# Patient Record
Sex: Female | Born: 1995 | Race: Black or African American | Hispanic: No | Marital: Single | Smoking: Current some day smoker
Health system: Southern US, Community
[De-identification: ages and names within clinical notes are randomized; demographics above are authoritative.]

## PROBLEM LIST (undated history)

## (undated) DIAGNOSIS — Z789 Other specified health status: Secondary | ICD-10-CM

## (undated) DIAGNOSIS — Q913 Trisomy 18, unspecified: Secondary | ICD-10-CM

## (undated) HISTORY — DX: Other specified health status: Z78.9

## (undated) HISTORY — PX: NO PAST SURGERIES: SHX2092

---

## 2019-08-17 ENCOUNTER — Encounter (HOSPITAL_COMMUNITY): Payer: Self-pay | Admitting: Obstetrics & Gynecology

## 2019-08-17 ENCOUNTER — Other Ambulatory Visit: Payer: Self-pay

## 2019-08-17 ENCOUNTER — Inpatient Hospital Stay (HOSPITAL_COMMUNITY): Payer: Self-pay

## 2019-08-17 ENCOUNTER — Inpatient Hospital Stay (HOSPITAL_COMMUNITY)
Admission: AD | Admit: 2019-08-17 | Discharge: 2019-08-18 | Disposition: A | Discharge status: Home / Self Care | Payer: Self-pay | Attending: Obstetrics & Gynecology | Admitting: Obstetrics & Gynecology

## 2019-08-17 DIAGNOSIS — O209 Hemorrhage in early pregnancy, unspecified: Secondary | ICD-10-CM | POA: Insufficient documentation

## 2019-08-17 DIAGNOSIS — O4690 Antepartum hemorrhage, unspecified, unspecified trimester: Secondary | ICD-10-CM

## 2019-08-17 DIAGNOSIS — Z3A01 Less than 8 weeks gestation of pregnancy: Secondary | ICD-10-CM | POA: Insufficient documentation

## 2019-08-17 DIAGNOSIS — O3680X0 Pregnancy with inconclusive fetal viability, not applicable or unspecified: Secondary | ICD-10-CM

## 2019-08-17 LAB — CBC
HCT: 37.6 % (ref 36.0–46.0)
Hemoglobin: 11.7 g/dL — ABNORMAL LOW (ref 12.0–15.0)
MCH: 24.9 pg — ABNORMAL LOW (ref 26.0–34.0)
MCHC: 31.1 g/dL (ref 30.0–36.0)
MCV: 80 fL (ref 80.0–100.0)
Platelets: 352 10*3/uL (ref 150–400)
RBC: 4.7 MIL/uL (ref 3.87–5.11)
RDW: 15.3 % (ref 11.5–15.5)
WBC: 8.7 10*3/uL (ref 4.0–10.5)
nRBC: 0 % (ref 0.0–0.2)

## 2019-08-17 LAB — ABO/RH: ABO/RH(D): O POS

## 2019-08-17 LAB — WET PREP, GENITAL
Clue Cells Wet Prep HPF POC: NONE SEEN
Sperm: NONE SEEN
Trich, Wet Prep: NONE SEEN
Yeast Wet Prep HPF POC: NONE SEEN

## 2019-08-17 LAB — URINALYSIS, ROUTINE W REFLEX MICROSCOPIC
Bilirubin Urine: NEGATIVE
Glucose, UA: NEGATIVE mg/dL
Ketones, ur: NEGATIVE mg/dL
Leukocytes,Ua: NEGATIVE
Nitrite: NEGATIVE
Protein, ur: NEGATIVE mg/dL
RBC / HPF: >50 RBC/hpf — ABNORMAL HIGH (ref 0–5)
Specific Gravity, Urine: 1.013 (ref 1.005–1.030)
pH: 5 (ref 5.0–8.0)

## 2019-08-17 LAB — HCG, QUANTITATIVE, PREGNANCY: hCG, Beta Chain, Quant, S: 125 m[IU]/mL — ABNORMAL HIGH (ref <5)

## 2019-08-17 LAB — POCT PREGNANCY, URINE: Preg Test, Ur: POSITIVE — AB

## 2019-08-17 NOTE — MAU Note (Addendum)
Pt states she found out she was pregnant. Went to hospital in [place] for some "mild bleeding" 2 days ago. Bleeding started last Saturday. States hcg was 96 and nothing was seen in womb. Here for worsening bleeding. States she passed a few clots yesterday. States she has to change pad multiple times a day because she doesn't like the odor. Denies pain. Had some minor cramping earlier today. LMP: 07/01/2019.

## 2019-08-17 NOTE — MAU Provider Note (Signed)
History     CSN: [id number]  Arrival date and time: 08/17/19 2009   First Provider Initiated Contact with Patient 08/17/19 2201      Chief Complaint  Patient presents with  . Vaginal Bleeding   Emily Marsh is a 24 y.o. G4P3003 at 6.5 weeks by Definite LMP of 07/01/2019.  She presents today for Vaginal Bleeding.  She had positive UPT 2 weeks ago and had light bleeding one week later.  She states she went to the hospital and was told that it could be miscarriage, but they were not sure.  Patient states her hCG was 96 on Thursday at a hospital in [place].  Patient reports that she was initially seeing blood only with wiping, but is now wearing a pad.  She also endorses passing clots that of varying sizes, but expresses concern about one the size of a "big green grape" that she noticed in the toilet.  Patient denies pain since onset of bleeding.  She also denies sexual activity. She reports she had some discharge, but contributes this to normal discharge for pregnancy denying itching, burning, or odor.       OB History    Gravida  4   Para  3   Term  3   Preterm  0   AB  0   Living  3     SAB  0   TAB  0   Ectopic  0   Multiple  0   Live Births  3           History reviewed. No pertinent past medical history.  History reviewed. No pertinent surgical history.  No family history on file.  Social History   Tobacco Use  . Smoking status: Never Smoker  . Smokeless tobacco: Never Used  Substance Use Topics  . Alcohol use: Not Currently  . Drug use: Never    Allergies: No Known Allergies  No medications prior to admission.    Review of Systems  Constitutional: Negative for chills and fever.  Respiratory: Negative for cough and shortness of breath.   Gastrointestinal: Negative for abdominal pain, nausea and vomiting.  Genitourinary: Positive for vaginal bleeding. Negative for difficulty urinating, dysuria, pelvic pain and vaginal discharge.   Neurological: Negative for dizziness, light-headedness and headaches.   Physical Exam   Blood pressure (!) 132/57, pulse 70, temperature 98.5 F (36.9 C), temperature source Oral, resp. rate 20, height 5\' 8"  (1.727 m), weight 100.5 kg, last menstrual period 07/01/2019, SpO2 100 %.  Physical Exam  Constitutional: She is oriented to person, place, and time. She appears well-developed and well-nourished. No distress.  HENT:  Head: Normocephalic and atraumatic.  Eyes: Conjunctivae are normal.  Cardiovascular: Normal rate, regular rhythm and normal heart sounds.  Respiratory: Effort normal and breath sounds normal.  GI: Soft. Bowel sounds are normal. There is no abdominal tenderness.  Genitourinary: Uterus is not enlarged. Cervix exhibits no motion tenderness and no friability.    Vaginal bleeding present.     No vaginal discharge.  There is bleeding in the vagina.    Genitourinary Comments: Speculum Exam: -Normal External Genitalia: Non tender, no apparent discharge at introitus.  -Vaginal Vault: Pink mucosa with good rugae. Small amt blood noted and removed with faux swab x 1 -wet prep collected -Cervix:Pink, no lesions, cysts, or polyps.  Appears closed. No active bleeding from os-GC/CT collected -Bimanual Exam:  Closed    Musculoskeletal:        General: No edema. Normal  range of motion.     Cervical back: Normal range of motion.  Neurological: She is alert and oriented to person, place, and time.  Skin: Skin is warm and dry.  Psychiatric: She has a normal mood and affect. Her behavior is normal.    MAU Course  Procedures Results for orders placed or performed during the hospital encounter of 08/17/19 (from the past 24 hour(s))  Pregnancy, urine POC     Status: Abnormal   Collection Time: 08/17/19  8:49 PM  Result Value Ref Range   Preg Test, Ur POSITIVE (A) NEGATIVE  Urinalysis, Routine w reflex microscopic     Status: Abnormal   Collection Time: 08/17/19  9:13 PM  Result  Value Ref Range   Color, Urine YELLOW YELLOW   APPearance CLEAR CLEAR   Specific Gravity, Urine 1.013 1.005 - 1.030   pH 5.0 5.0 - 8.0   Glucose, UA NEGATIVE NEGATIVE mg/dL   Hgb urine dipstick LARGE (A) NEGATIVE   Bilirubin Urine NEGATIVE NEGATIVE   Ketones, ur NEGATIVE NEGATIVE mg/dL   Protein, ur NEGATIVE NEGATIVE mg/dL   Nitrite NEGATIVE NEGATIVE   Leukocytes,Ua NEGATIVE NEGATIVE   RBC / HPF >50 (H) 0 - 5 RBC/hpf   WBC, UA 6-10 0 - 5 WBC/hpf   Bacteria, UA FEW (A) NONE SEEN   Squamous Epithelial / LPF 0-5 0 - 5   Mucus PRESENT   Wet prep, genital     Status: Abnormal   Collection Time: 08/17/19 10:14 PM  Result Value Ref Range   Yeast Wet Prep HPF POC NONE SEEN NONE SEEN   Trich, Wet Prep NONE SEEN NONE SEEN   Clue Cells Wet Prep HPF POC NONE SEEN NONE SEEN   WBC, Wet Prep HPF POC FEW (A) NONE SEEN   Sperm NONE SEEN   CBC     Status: Abnormal   Collection Time: 08/17/19 10:34 PM  Result Value Ref Range   WBC 8.7 4.0 - 10.5 K/uL   RBC 4.70 3.87 - 5.11 MIL/uL   Hemoglobin 11.7 (L) 12.0 - 15.0 g/dL   HCT 62.8 31.5 - 17.6 %   MCV 80.0 80.0 - 100.0 fL   MCH 24.9 (L) 26.0 - 34.0 pg   MCHC 31.1 30.0 - 36.0 g/dL   RDW 16.0 73.7 - 10.6 %   Platelets 352 150 - 400 K/uL   nRBC 0.0 0.0 - 0.2 %  hCG, quantitative, pregnancy     Status: Abnormal   Collection Time: 08/17/19 10:34 PM  Result Value Ref Range   hCG, Beta Chain, Quant, S 125 (H) <5 mIU/mL  ABO/Rh     Status: None   Collection Time: 08/17/19 10:34 PM  Result Value Ref Range   ABO/RH(D) O POS    No rh immune globuloin      NOT A RH IMMUNE GLOBULIN CANDIDATE, PT RH POSITIVE Performed at [facility], [address], [place], [place] [address]    US OB LESS THAN 14 WEEKS WITH OB TRANSVAGINAL  Result Date: 08/17/2019 CLINICAL DATA:  Bleeding EXAM: OBSTETRIC <14 WK [place] AND TRANSVAGINAL OB US TECHNIQUE: Both transabdominal and transvaginal ultrasound examinations were performed for complete evaluation of the  gestation as well as the maternal uterus, adnexal regions, and pelvic cul-de-sac. Transvaginal technique was performed to assess early pregnancy. COMPARISON:  None. FINDINGS: Intrauterine gestational sac: Single Yolk sac:  Not Visualized. Embryo:  Not Visualized. Cardiac Activity: Not Visualized. MSD: 3.3 mm   5 w  0 d Subchorionic hemorrhage:  None visualized. Maternal uterus/adnexae: There is a probable corpus luteal cyst involving the left ovary. The right ovary is grossly unremarkable. There is a trace amount of free fluid in the patient's pelvis. IMPRESSION: 1. Probable early gestational sac at 5 weeks and 0 days. Recommend follow-up quantitative B-HCG levels and follow-up US in 14 days to assess viability. This recommendation follows SRU consensus guidelines: Diagnostic Criteria for Nonviable Pregnancy Early in the First Trimester. [place] [employer] Med 2013; 258:5277-82. 2. No maternal abnormality detected. Electronically Signed   By: Constance Holster M.D.   On: 08/17/2019 23:34    MDM Pelvic Exam; Wet Prep and GC/CT Labs: UA, UPT, CBC, hCG, ABO Ultrasound Assessment and Plan  24 year old U2P5361 at 6.5weeks Vaginal Bleeding  -Reviewed POC with patient. -Exam performed and findings discussed.  -Cultures collected and pending.  -Patient informed that results may return inconclusive considering that provider unable to review previous results.   -Labs ordered. -Will send for [place]  Deshanti Adcox L Adajah Cocking 08/17/2019, 10:01 PM   Reassessment (12:44 AM)  -Labs and [place] return as above. -Informed that quant did not rise as anticipated, but other factors could  can cause variations in results (like time of draw and/or lab used). -Patient questions if she is having a miscarriage or has an ectopic pregnancy. -Patient informed that further evaluation is necessary and recommendation for repeat quant in 48 hours given. -Patient agreeable and scheduled for follow up quant on Tuesday at 2pm at [facility]. -Bleeding  precautions given. -Patient without further questions or concerns.  -Encouraged to call or return to MAU if symptoms worsen or with the onset of new symptoms. -Discharged to home in stable condition.  Maryann Conners MSN, CNM Advanced Practice Provider, [facility] [employer]

## 2019-08-18 DIAGNOSIS — O3680X0 Pregnancy with inconclusive fetal viability, not applicable or unspecified: Secondary | ICD-10-CM

## 2019-08-19 LAB — GC/CHLAMYDIA PROBE AMP (~~LOC~~) NOT AT ARMC
Chlamydia: NEGATIVE
Comment: NEGATIVE
Comment: NORMAL
Neisseria Gonorrhea: NEGATIVE

## 2019-08-20 ENCOUNTER — Ambulatory Visit: Payer: Self-pay

## 2019-08-21 ENCOUNTER — Telehealth: Payer: Self-pay

## 2019-08-21 NOTE — Telephone Encounter (Signed)
Called pt at and pt contact on 08/20/19 at 1600 to follow up on missed stat beta appt. Error message received that call could not be completed at either number.  Called pt at 0820; error message received that phone is not in service. Called pt contact at 0820; error message received that call cannot be completed at this time.

## 2019-08-26 ENCOUNTER — Telehealth: Payer: Self-pay | Admitting: *Deleted

## 2019-08-26 ENCOUNTER — Other Ambulatory Visit: Payer: Self-pay

## 2019-08-26 DIAGNOSIS — O4690 Antepartum hemorrhage, unspecified, unspecified trimester: Secondary | ICD-10-CM

## 2019-08-26 NOTE — Telephone Encounter (Signed)
Emily Marsh called front desk and wants to get bhcg checked. Per chart review was supposed to get stat bhcg 08/21/19  But [facility]. Patient reported negative pregnancy test at home and bleeding stopped. Discussed with Dr. Debroah Loop and ordered may get non stat bhcg.  Legrand Como

## 2019-08-27 LAB — BETA HCG QUANT (REF LAB): hCG Quant: <1 m[IU]/mL

## 2019-12-12 ENCOUNTER — Emergency Department (HOSPITAL_COMMUNITY)
Admission: EM | Admit: 2019-12-12 | Discharge: 2019-12-12 | Disposition: A | Discharge status: Home / Self Care | Payer: No Typology Code available for payment source | Attending: Emergency Medicine | Admitting: Emergency Medicine

## 2019-12-12 ENCOUNTER — Encounter (HOSPITAL_COMMUNITY): Payer: Self-pay

## 2019-12-12 ENCOUNTER — Other Ambulatory Visit: Payer: Self-pay

## 2019-12-12 DIAGNOSIS — M79605 Pain in left leg: Secondary | ICD-10-CM | POA: Diagnosis not present

## 2019-12-12 DIAGNOSIS — R52 Pain, unspecified: Secondary | ICD-10-CM | POA: Diagnosis present

## 2019-12-12 DIAGNOSIS — Y999 Unspecified external cause status: Secondary | ICD-10-CM | POA: Diagnosis not present

## 2019-12-12 DIAGNOSIS — S50811A Abrasion of right forearm, initial encounter: Secondary | ICD-10-CM | POA: Insufficient documentation

## 2019-12-12 DIAGNOSIS — Y939 Activity, unspecified: Secondary | ICD-10-CM | POA: Insufficient documentation

## 2019-12-12 DIAGNOSIS — Y929 Unspecified place or not applicable: Secondary | ICD-10-CM | POA: Insufficient documentation

## 2019-12-12 DIAGNOSIS — M542 Cervicalgia: Secondary | ICD-10-CM | POA: Insufficient documentation

## 2019-12-12 MED ORDER — TIZANIDINE HCL 2 MG PO CAPS: 2.0000 mg | ORAL_CAPSULE | Freq: Three times a day (TID) | ORAL | 0 refills | Status: AC

## 2019-12-12 MED ORDER — NAPROXEN 500 MG PO TABS: 500.0000 mg | ORAL_TABLET | Freq: Two times a day (BID) | ORAL | 0 refills | Status: DC
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
## 2019-12-12 NOTE — ED Notes (Signed)
Discharge instructions reviewed with pt. Pt verbalized understanding.   

## 2020-05-07 DIAGNOSIS — O09299 Supervision of pregnancy with other poor reproductive or obstetric history, unspecified trimester: Secondary | ICD-10-CM | POA: Insufficient documentation

## 2020-07-21 DIAGNOSIS — Z720 Tobacco use: Secondary | ICD-10-CM | POA: Insufficient documentation

## 2020-09-01 DIAGNOSIS — O365990 Maternal care for other known or suspected poor fetal growth, unspecified trimester, not applicable or unspecified: Secondary | ICD-10-CM | POA: Insufficient documentation

## 2021-02-16 ENCOUNTER — Ambulatory Visit (INDEPENDENT_AMBULATORY_CARE_PROVIDER_SITE_OTHER): Payer: Medicaid Other | Admitting: *Deleted

## 2021-02-16 ENCOUNTER — Other Ambulatory Visit: Payer: Self-pay

## 2021-02-16 VITALS — BP 130/79 | HR 76 | Temp 98.3°F | Ht 68.0 in | Wt 234.6 lb

## 2021-02-16 DIAGNOSIS — Z3201 Encounter for pregnancy test, result positive: Secondary | ICD-10-CM | POA: Diagnosis not present

## 2021-02-16 DIAGNOSIS — Z789 Other specified health status: Secondary | ICD-10-CM

## 2021-02-16 LAB — POCT URINE PREGNANCY: Preg Test, Ur: POSITIVE — AB

## 2021-02-16 NOTE — Progress Notes (Signed)
   Emily Marsh presents today for UPT. She has no unusual complaints. LMP: Unknown  Patient reported that she delivered Mar 27, 2022and baby passed on 10-Feb-2021. 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 Patient just moved here from [place].    OBJECTIVE: Appears well, in no apparent distress.  OB History     Gravida  5   Para  3   Term  3   Preterm  0   AB  0   Living  3      SAB  0   IAB  0   Ectopic  0   Multiple  0   Live Births  3          Home UPT Result:Positive In-Office UPT result: Positive I have reviewed the patient's medical, obstetrical, social, and family histories, and medications.   ASSESSMENT: Positive pregnancy test  PLAN Prenatal care to be completed at: [facility] Ultrasound <14 weeks ordered to confirm dating and viability  Clovis Pu, RN

## 2021-02-18 ENCOUNTER — Telehealth: Payer: Self-pay | Admitting: Obstetrics and Gynecology

## 2021-02-18 ENCOUNTER — Other Ambulatory Visit: Payer: Self-pay | Admitting: Obstetrics and Gynecology

## 2021-02-18 ENCOUNTER — Ambulatory Visit
Admission: RE | Admit: 2021-02-18 | Discharge: 2021-02-18 | Disposition: A | Discharge status: Home / Self Care | Payer: Medicaid Other | Source: Ambulatory Visit | Attending: Obstetrics and Gynecology | Admitting: Obstetrics and Gynecology

## 2021-02-18 ENCOUNTER — Other Ambulatory Visit: Payer: Self-pay

## 2021-02-18 DIAGNOSIS — Z789 Other specified health status: Secondary | ICD-10-CM

## 2021-02-18 NOTE — Telephone Encounter (Signed)
Attempted to call patient to discuss [place] results.  Not able to leave VM as VM is full.    Venia Carbon I, NP 02/18/2021 2:15 PM

## 2021-03-04 IMAGING — US US OB < 14 WEEKS - US OB TV
1 series · 15 of 28 positions shown · non-contrast
Comparison: None.

CLINICAL DATA: Bleeding

EXAM:
OBSTETRIC <14 WK US AND TRANSVAGINAL OB US
TECHNIQUE: Both transabdominal and transvaginal ultrasound examinations were
performed for complete evaluation of the gestation as well as the
maternal uterus, adnexal regions, and pelvic cul-de-sac.
Transvaginal technique was performed to assess early pregnancy.

[Series 1: us ob < 14 weeks - us ob tv · 37 acquisitions, 15 frames shown]
[im 1/37]
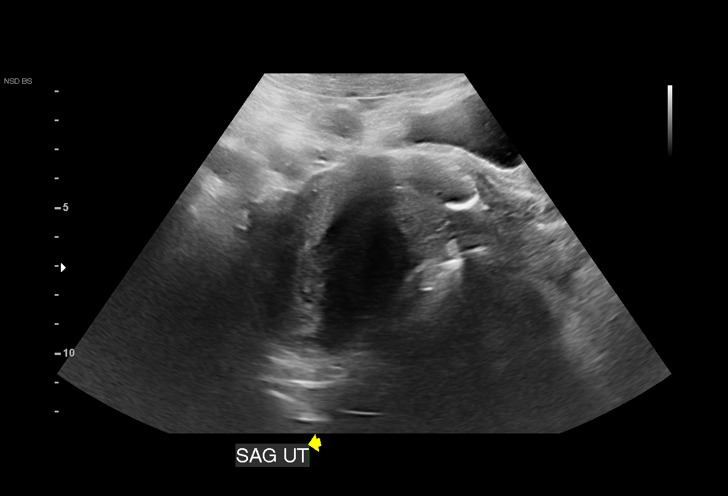
[im 3/37]
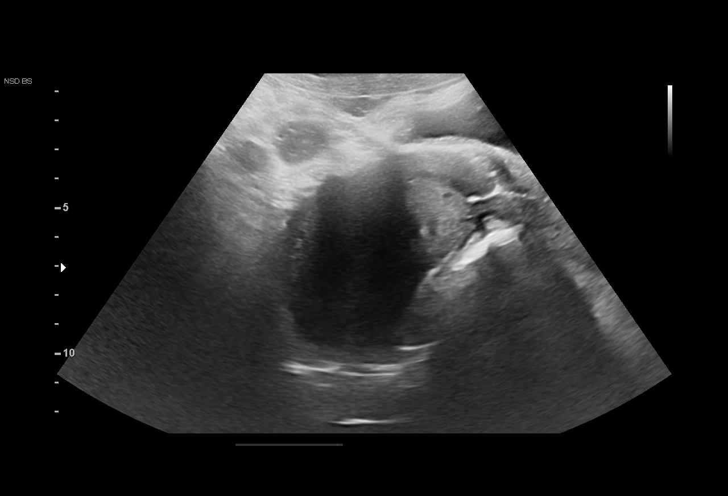
[im 6/37]
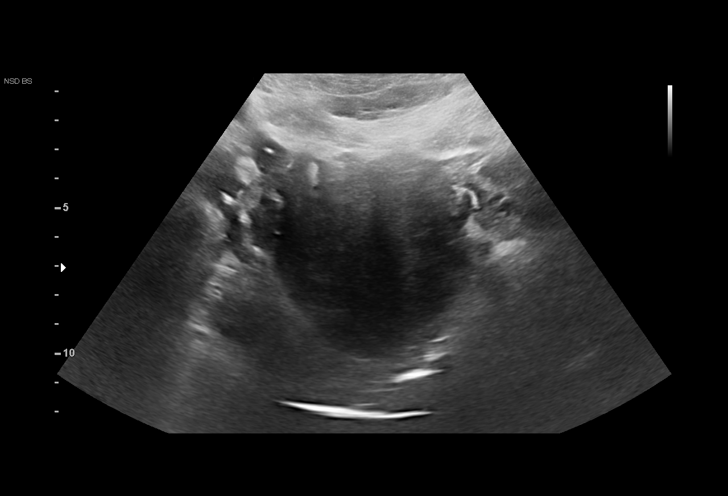
[im 9/37]
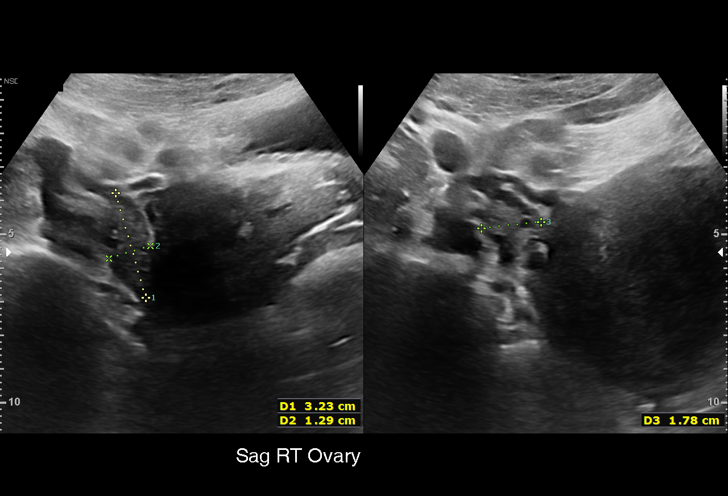
[im 11/37]
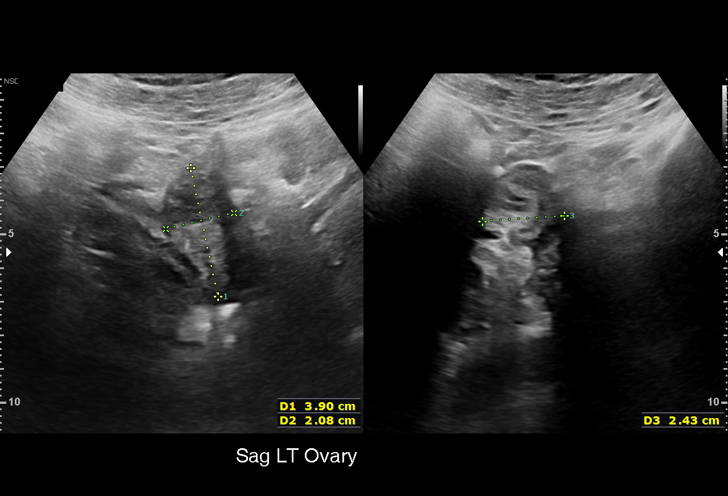
[im 14/37]
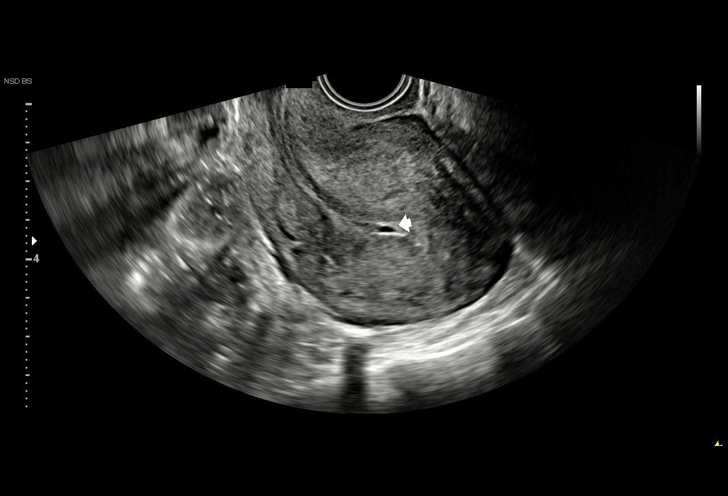
[im 17/37]
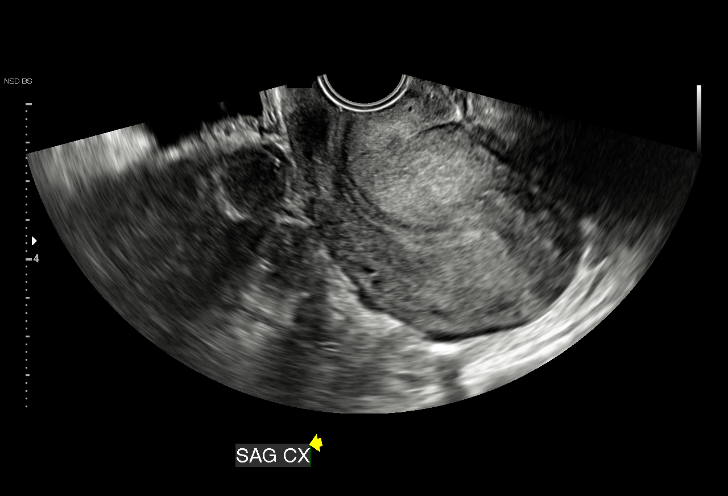
[im 19/37]
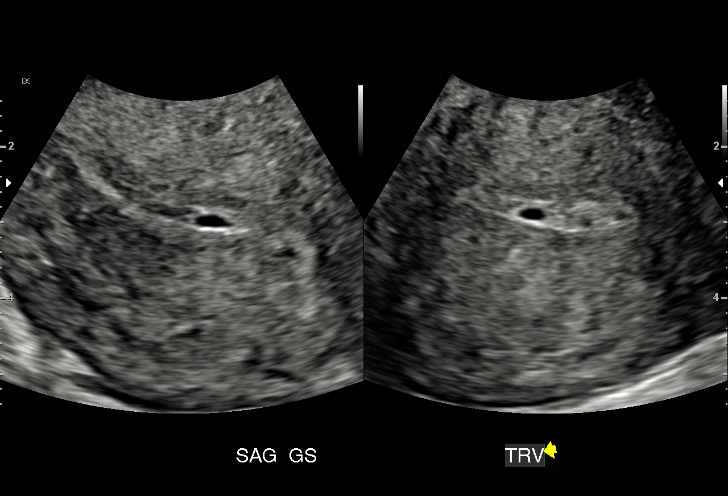
[im 21/37]
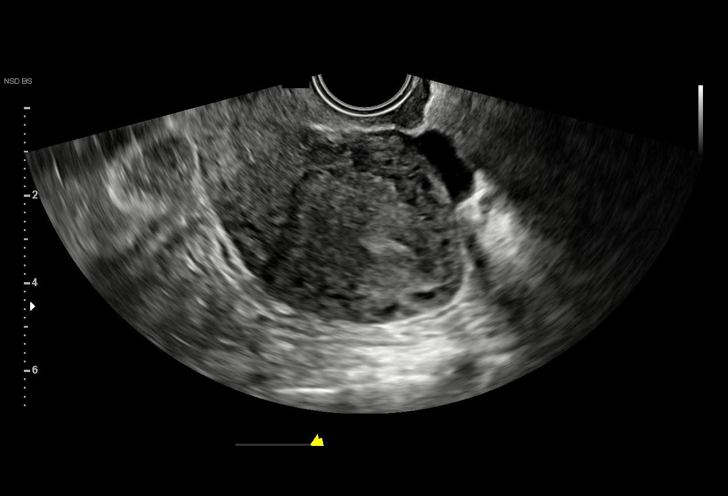
[im 23/37]
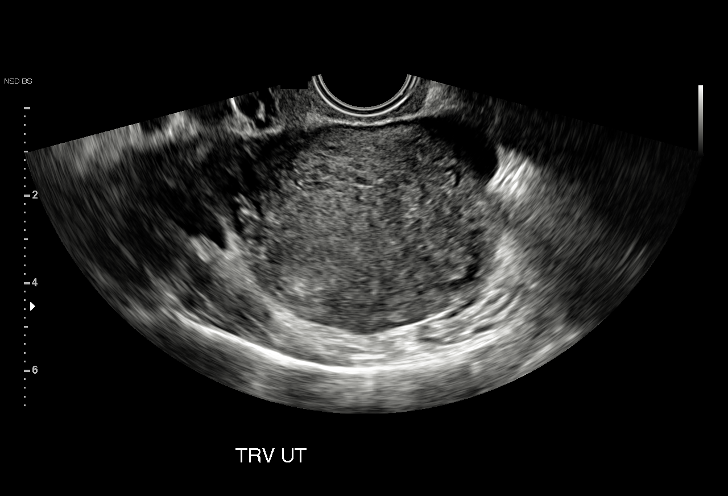
[im 26/37]
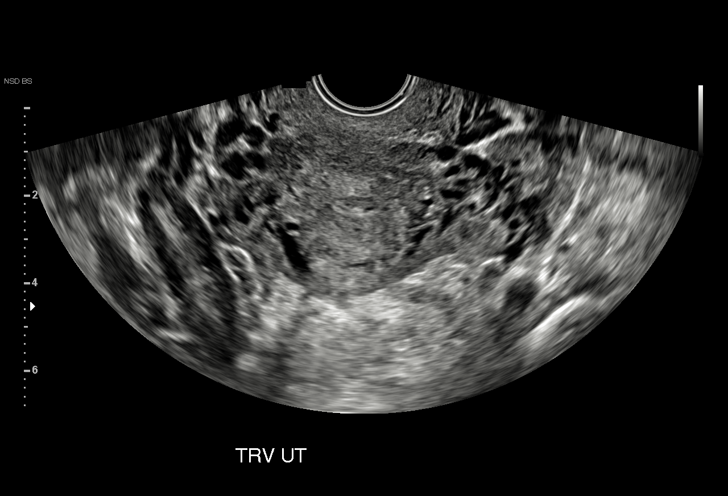
[im 29/37]
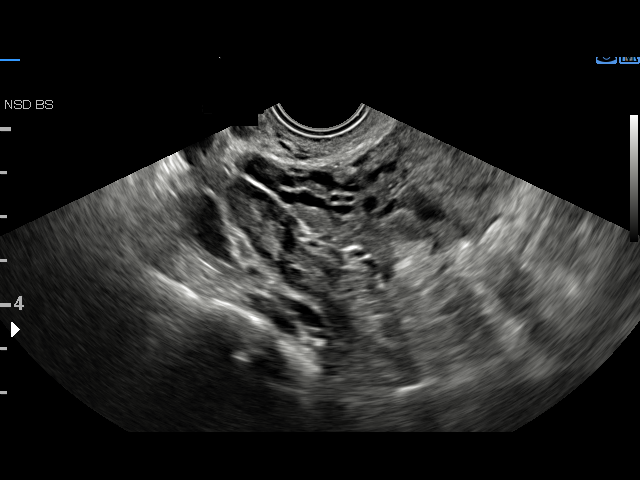
[im 31/37]
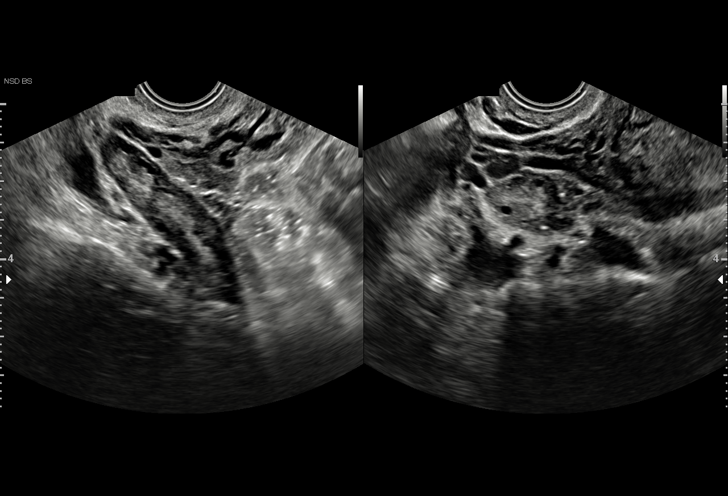
[im 34/37]
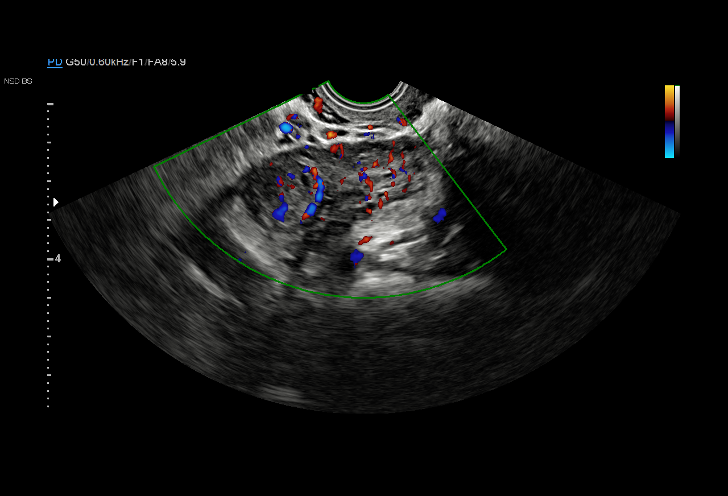
[im 37/37]
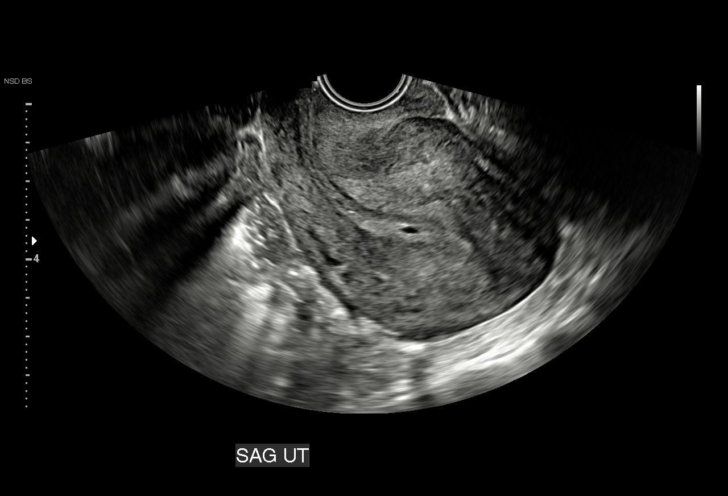

[15 of 28 positions shown; findings below may reference images not displayed]

FINDINGS: Intrauterine gestational sac: Single

Yolk sac:  Not Visualized.

Embryo:  Not Visualized.

Cardiac Activity: Not Visualized.

MSD: 3.3 mm   5 w   0 d

Subchorionic hemorrhage:  None visualized.

Maternal uterus/adnexae: There is a probable corpus luteal cyst
involving the left ovary. The right ovary is grossly unremarkable.
There is a trace amount of free fluid in the patient's pelvis.
IMPRESSION: 1. Probable early gestational sac at 5 weeks and 0 days. Recommend
follow-up quantitative B-HCG levels and follow-up US in 14 days to
assess viability. This recommendation follows SRU consensus
guidelines: Diagnostic Criteria for Nonviable Pregnancy Early in the
First Trimester. N Engl J Med 0156; [DATE].
2. No maternal abnormality detected.

## 2021-03-23 ENCOUNTER — Ambulatory Visit (INDEPENDENT_AMBULATORY_CARE_PROVIDER_SITE_OTHER): Payer: Medicaid Other | Admitting: Obstetrics and Gynecology

## 2021-03-23 ENCOUNTER — Other Ambulatory Visit: Payer: Self-pay

## 2021-03-23 ENCOUNTER — Encounter: Payer: Self-pay | Admitting: Obstetrics and Gynecology

## 2021-03-23 ENCOUNTER — Other Ambulatory Visit (HOSPITAL_COMMUNITY)
Admission: RE | Admit: 2021-03-23 | Discharge: 2021-03-23 | Disposition: A | Discharge status: Home / Self Care | Payer: Medicaid Other | Source: Ambulatory Visit | Attending: Obstetrics and Gynecology | Admitting: Obstetrics and Gynecology

## 2021-03-23 ENCOUNTER — Other Ambulatory Visit: Payer: Self-pay | Admitting: *Deleted

## 2021-03-23 VITALS — BP 131/76 | HR 83 | Wt 234.0 lb

## 2021-03-23 DIAGNOSIS — Z8481 Family history of carrier of genetic disease: Secondary | ICD-10-CM | POA: Diagnosis not present

## 2021-03-23 DIAGNOSIS — O09292 Supervision of pregnancy with other poor reproductive or obstetric history, second trimester: Secondary | ICD-10-CM | POA: Diagnosis not present

## 2021-03-23 DIAGNOSIS — Z720 Tobacco use: Secondary | ICD-10-CM

## 2021-03-23 DIAGNOSIS — O09299 Supervision of pregnancy with other poor reproductive or obstetric history, unspecified trimester: Secondary | ICD-10-CM

## 2021-03-23 DIAGNOSIS — Z3480 Encounter for supervision of other normal pregnancy, unspecified trimester: Secondary | ICD-10-CM | POA: Insufficient documentation

## 2021-03-23 DIAGNOSIS — O219 Vomiting of pregnancy, unspecified: Secondary | ICD-10-CM

## 2021-03-23 DIAGNOSIS — O99331 Smoking (tobacco) complicating pregnancy, first trimester: Secondary | ICD-10-CM | POA: Diagnosis not present

## 2021-03-23 DIAGNOSIS — Z3A13 13 weeks gestation of pregnancy: Secondary | ICD-10-CM | POA: Diagnosis not present

## 2021-03-23 DIAGNOSIS — Z8279 Family history of other congenital malformations, deformations and chromosomal abnormalities: Secondary | ICD-10-CM

## 2021-03-23 MED ORDER — ONDANSETRON 4 MG PO TBDP: 4.0000 mg | ORAL_TABLET | Freq: Three times a day (TID) | ORAL | 0 refills | Status: DC | PRN

## 2021-03-23 MED ORDER — GOJJI WEIGHT SCALE MISC: 1.0000 | 0 refills | Status: AC

## 2021-03-23 MED ORDER — NICOTINE POLACRILEX 2 MG MT GUM: 2.0000 mg | CHEWING_GUM | OROMUCOSAL | 0 refills | Status: AC | PRN

## 2021-03-23 MED ORDER — PROMETHAZINE HCL 25 MG PO TABS: 25.0000 mg | ORAL_TABLET | Freq: Four times a day (QID) | ORAL | 1 refills | Status: AC | PRN

## 2021-03-23 MED ORDER — BLOOD PRESSURE KIT DEVI: 1.0000 | 0 refills | Status: AC

## 2021-03-23 NOTE — Addendum Note (Signed)
Addended by: Maretta Bees on: 03/23/2021 02:00 PM   Modules accepted: Orders

## 2021-03-23 NOTE — Addendum Note (Signed)
Addended by: Maretta Bees on: 03/23/2021 02:34 PM   Modules accepted: Orders

## 2021-03-23 NOTE — Progress Notes (Signed)
History:   Emily Marsh is a 25 y.o. G5P3003 at [redacted]w[redacted]d by early ultrasound being seen today for her first obstetrical visit.  Her obstetrical history is significant for T18 with her last delivery done at [facility] - she delivered full term.  She also does smoke a pack about every 3 days. Patient does intend to breast feed. Pregnancy history fully reviewed.  Patient reports no complaints.      HISTORY: OB History  Gravida Para Term Preterm AB Living  5 4 4  0 0 3  SAB IAB Ectopic Multiple Live Births  0 0 0 0 4    # Outcome Date GA Lbr Len/2nd Weight Sex Delivery Anes PTL Lv  5 Current           4 Term     F Vag-Spont   ND     Complications: Trisomy 1  3 Term      Vag-Spont     2 Term      Vag-Spont     1 Term      Vag-Spont       Obstetric Comments  Tested to 8lb6oz    Last pap smear was done 11/21 and was normal - at [facility]  History reviewed. No pertinent past medical history. History reviewed. No pertinent surgical history. No family history on file. Social History   Tobacco Use   Smoking status: Former    Packs/day: 0.50    Types: Cigarettes   Smokeless tobacco: Never  Substance Use Topics   Alcohol use: Not Currently   Drug use: Never   No Known Allergies Current Outpatient Medications on File Prior to Visit  Medication Sig Dispense Refill   naproxen (NAPROSYN) 500 MG tablet Take 1 tablet (500 mg total) by mouth 2 (two) times daily. 30 tablet 0   No current facility-administered medications on file prior to visit.    Review of Systems Pertinent items noted in HPI and remainder of comprehensive ROS otherwise negative.  Physical Exam:   Vitals:   03/23/21 1304  BP: 131/76  Pulse: 83  Weight: 234 lb (106.1 kg)    General: well-developed, well-nourished female in no acute distress  Breasts:  normal appearance, no masses or tenderness bilaterally  Skin: normal coloration and turgor, no rashes  Neurologic: oriented, normal, negative, normal mood   Extremities: normal strength, tone, and muscle mass, ROM of all joints is normal  HEENT PERRLA, extraocular movement intact and sclera clear, anicteric  Neck supple and no masses  Cardiovascular: regular rate and rhythm  Respiratory:  no respiratory distress, normal breath sounds  Abdomen: soft, non-tender; bowel sounds normal; no masses,  no organomegaly  Pelvic: normal external genitalia, no lesions, normal vaginal mucosa, normal vaginal discharge    Assessment:    Pregnancy: 03/25/21 Patient Active Problem List   Diagnosis Date Noted   Supervision of other normal pregnancy, antepartum 03/23/2021   Poor fetal growth affecting management of mother, antepartum 09/01/2020   Tobacco abuse 07/21/2020   Aneuploidy in fetus affecting management of mother 05/07/2020     Plan:    1. Supervision of other normal pregnancy, antepartum - Discussed tobacco abuse in pregnancy and risks associated. She would like to try to quit. Rx sent for nicotine gum given her low amount of 5-6cpd.  - Discussed h/o T36 - was not diagnosed by Quad screen but subsequently found by T15. She carried to term and had uncomplicated delivery but then lost the baby (girl). We discussed accuracy of NIPT  and most accurate would be amnio. She would like to avoid the amnio if possible. NIPT done today.  - Reviewed OB history - otherwise no history of complications with pregnancies or with deliveries.    Initial labs drawn. Continue prenatal vitamins. Problem list reviewed and updated. Genetic Screening discussed, First trimester screen, Quad screen, and NIPS:  she would like NIPS . Ultrasound discussed; fetal anatomic survey: ordered. Anticipatory guidance about prenatal visits given including labs, ultrasounds, and testing. Discussed usage of Babyscripts and virtual visits as additional source of managing and completing prenatal visits in midst of coronavirus and pandemic.   Encouraged to complete MyChart Registration for  her ability to review results, send requests, and have questions addressed.  The nature of [facility] with multiple MDs and Advanced Practice Providers was explained to patient; also emphasized that residents, students are part of our team. Routine obstetric precautions reviewed. Encouraged to seek out care at office or emergency room [facility] MAU preferred) for urgent and/or emergent concerns. No follow-ups on file.    Milas Hock, MD, FACOG Obstetrician & Gynecologist, [facility], [facility]

## 2021-03-24 LAB — OBSTETRIC PANEL, INCLUDING HIV
Antibody Screen: NEGATIVE
Basophils Absolute: 0 10*3/uL (ref 0.0–0.2)
Basos: 0 %
EOS (ABSOLUTE): 0.1 10*3/uL (ref 0.0–0.4)
Eos: 1 %
HIV Screen 4th Generation wRfx: NONREACTIVE
Hematocrit: 35.5 % (ref 34.0–46.6)
Hemoglobin: 11.4 g/dL (ref 11.1–15.9)
Hepatitis B Surface Ag: NEGATIVE
Immature Grans (Abs): 0 10*3/uL (ref 0.0–0.1)
Immature Granulocytes: 0 %
Lymphocytes Absolute: 2 10*3/uL (ref 0.7–3.1)
Lymphs: 24 %
MCH: 26.1 pg — ABNORMAL LOW (ref 26.6–33.0)
MCHC: 32.1 g/dL (ref 31.5–35.7)
MCV: 81 fL (ref 79–97)
Monocytes Absolute: 0.5 10*3/uL (ref 0.1–0.9)
Monocytes: 6 %
Neutrophils Absolute: 5.7 10*3/uL (ref 1.4–7.0)
Neutrophils: 69 %
Platelets: 284 10*3/uL (ref 150–450)
RBC: 4.37 x10E6/uL (ref 3.77–5.28)
RDW: 15.6 % — ABNORMAL HIGH (ref 11.7–15.4)
RPR Ser Ql: NONREACTIVE
Rh Factor: POSITIVE
Rubella Antibodies, IGG: 2.37 index (ref >0.99)
WBC: 8.3 10*3/uL (ref 3.4–10.8)

## 2021-03-24 LAB — CERVICOVAGINAL ANCILLARY ONLY
Chlamydia: NEGATIVE
Comment: NEGATIVE
Comment: NORMAL
Neisseria Gonorrhea: NEGATIVE

## 2021-03-24 LAB — HEPATITIS C ANTIBODY: Hep C Virus Ab: <0.1 s/co ratio (ref 0.0–0.9)

## 2021-03-25 LAB — CULTURE, OB URINE

## 2021-03-25 LAB — URINE CULTURE, OB REFLEX

## 2021-03-30 ENCOUNTER — Encounter: Payer: Self-pay | Admitting: Obstetrics and Gynecology

## 2021-04-06 ENCOUNTER — Encounter: Payer: Self-pay | Admitting: Obstetrics and Gynecology

## 2021-04-12 ENCOUNTER — Other Ambulatory Visit: Payer: Self-pay

## 2021-04-12 DIAGNOSIS — O219 Vomiting of pregnancy, unspecified: Secondary | ICD-10-CM

## 2021-04-14 MED ORDER — ONDANSETRON 4 MG PO TBDP: 4.0000 mg | ORAL_TABLET | Freq: Three times a day (TID) | ORAL | 0 refills | Status: DC | PRN

## 2021-04-20 ENCOUNTER — Other Ambulatory Visit: Payer: Self-pay

## 2021-04-20 ENCOUNTER — Ambulatory Visit (INDEPENDENT_AMBULATORY_CARE_PROVIDER_SITE_OTHER): Payer: Medicaid Other | Admitting: Obstetrics & Gynecology

## 2021-04-20 VITALS — BP 126/82 | HR 81 | Wt 238.7 lb

## 2021-04-20 DIAGNOSIS — Z3A17 17 weeks gestation of pregnancy: Secondary | ICD-10-CM | POA: Diagnosis not present

## 2021-04-20 DIAGNOSIS — O219 Vomiting of pregnancy, unspecified: Secondary | ICD-10-CM | POA: Diagnosis not present

## 2021-04-20 DIAGNOSIS — Z3480 Encounter for supervision of other normal pregnancy, unspecified trimester: Secondary | ICD-10-CM

## 2021-04-20 MED ORDER — ONDANSETRON 4 MG PO TBDP: 4.0000 mg | ORAL_TABLET | Freq: Three times a day (TID) | ORAL | 0 refills | Status: DC | PRN

## 2021-04-20 NOTE — Progress Notes (Signed)
   PRENATAL VISIT NOTE  Subjective:  Emily Marsh is a 25 y.o. G8Q7619 at [redacted]w[redacted]d being seen today for ongoing prenatal care.  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  Patient reports no complaints.  Contractions: Not present. Vag. Bleeding: None.  Movement: Present. Denies leaking of fluid.   The following portions of the patient's history were reviewed and updated as appropriate: allergies, current medications, past family history, past medical history, past social history, past surgical history and problem list.   Objective:   Vitals:   04/20/21 1347  BP: 126/82  Pulse: 81  Weight: 238 lb 11.2 oz (108.3 kg)    Fetal Status: Fetal Heart Rate (bpm): 140   Movement: Present     General:  Alert, oriented and cooperative. Patient is in no acute distress.  Skin: Skin is warm and dry. No rash noted.   Cardiovascular: Normal heart rate noted  Respiratory: Normal respiratory effort, no problems with respiration noted  Abdomen: Soft, gravid, appropriate for gestational age.  Pain/Pressure: Absent     Pelvic: Cervical exam deferred        Extremities: Normal range of motion.  Edema: None  Mental Status: Normal mood and affect. Normal behavior. Normal judgment and thought content.   Assessment and Plan:  Pregnancy: G5P4003 at [redacted]w[redacted]d 1. Supervision of other normal pregnancy, antepartum Screen ONTD - AFP, Serum, Open Spina Bifida  2. Nausea and vomiting during pregnancy Refilled medication - ondansetron (ZOFRAN ODT) 4 MG disintegrating tablet; Take 1 tablet (4 mg total) by mouth every 8 (eight) hours as needed for nausea or vomiting.  Dispense: 30 tablet; Refill: 0  Preterm labor symptoms and general obstetric precautions including but not limited to vaginal bleeding, contractions, leaking of  fluid and fetal movement were reviewed in detail with the patient. Please refer to After Visit Summary for other counseling recommendations.   Return in about 4 weeks (around 05/18/2021).  Future Appointments  Date Time Provider Department Center  05/03/2021  1:30 PM [facility] NURSE [facility]  05/03/2021  1:45 PM [facility] US5 [facility]    Scheryl Darter, MD

## 2021-04-20 NOTE — Progress Notes (Signed)
Patient reports for ROB. Patient has no concerns today.

## 2021-04-22 LAB — AFP, SERUM, OPEN SPINA BIFIDA
AFP MoM: 0.65
AFP Value: 21.7 ng/mL
Gest. Age on Collection Date: 17 weeks
Maternal Age At EDD: 25.4 yr
OSBR Risk 1 IN: 10000
Test Results:: NEGATIVE
Weight: 238 [lb_av]

## 2021-04-30 ENCOUNTER — Other Ambulatory Visit: Payer: Self-pay

## 2021-04-30 DIAGNOSIS — O219 Vomiting of pregnancy, unspecified: Secondary | ICD-10-CM

## 2021-05-03 ENCOUNTER — Other Ambulatory Visit: Payer: Self-pay

## 2021-05-03 ENCOUNTER — Ambulatory Visit: Payer: Medicaid Other | Attending: Obstetrics and Gynecology

## 2021-05-03 ENCOUNTER — Other Ambulatory Visit: Payer: Self-pay | Admitting: *Deleted

## 2021-05-03 ENCOUNTER — Ambulatory Visit: Payer: Medicaid Other | Admitting: *Deleted

## 2021-05-03 VITALS — BP 123/47 | HR 74

## 2021-05-03 DIAGNOSIS — O09292 Supervision of pregnancy with other poor reproductive or obstetric history, second trimester: Secondary | ICD-10-CM

## 2021-05-03 DIAGNOSIS — Z6841 Body Mass Index (BMI) 40.0 and over, adult: Secondary | ICD-10-CM | POA: Diagnosis present

## 2021-05-03 DIAGNOSIS — Z3480 Encounter for supervision of other normal pregnancy, unspecified trimester: Secondary | ICD-10-CM | POA: Insufficient documentation

## 2021-05-03 MED ORDER — ONDANSETRON 4 MG PO TBDP: 4.0000 mg | ORAL_TABLET | Freq: Three times a day (TID) | ORAL | 0 refills | Status: DC | PRN

## 2021-05-04 ENCOUNTER — Encounter: Payer: Self-pay | Admitting: Obstetrics and Gynecology

## 2021-05-04 DIAGNOSIS — O35BXX0 Maternal care for other (suspected) fetal abnormality and damage, fetal cardiac anomalies, not applicable or unspecified: Secondary | ICD-10-CM | POA: Insufficient documentation

## 2021-05-10 ENCOUNTER — Other Ambulatory Visit: Payer: Self-pay

## 2021-05-10 ENCOUNTER — Other Ambulatory Visit: Payer: Self-pay | Admitting: Obstetrics & Gynecology

## 2021-05-10 DIAGNOSIS — O219 Vomiting of pregnancy, unspecified: Secondary | ICD-10-CM

## 2021-05-11 ENCOUNTER — Other Ambulatory Visit: Payer: Self-pay

## 2021-05-11 ENCOUNTER — Other Ambulatory Visit: Payer: Self-pay | Admitting: Obstetrics & Gynecology

## 2021-05-11 DIAGNOSIS — O219 Vomiting of pregnancy, unspecified: Secondary | ICD-10-CM

## 2021-05-11 MED ORDER — ONDANSETRON 4 MG PO TBDP: 4.0000 mg | ORAL_TABLET | Freq: Three times a day (TID) | ORAL | 0 refills | Status: DC | PRN

## 2021-05-13 ENCOUNTER — Other Ambulatory Visit: Payer: Self-pay | Admitting: Obstetrics & Gynecology

## 2021-05-13 DIAGNOSIS — O219 Vomiting of pregnancy, unspecified: Secondary | ICD-10-CM

## 2021-05-18 ENCOUNTER — Encounter: Payer: Medicaid Other | Admitting: Obstetrics & Gynecology

## 2021-05-31 ENCOUNTER — Encounter: Payer: Medicaid Other | Admitting: Obstetrics and Gynecology

## 2021-06-01 ENCOUNTER — Ambulatory Visit: Payer: No Typology Code available for payment source

## 2021-06-04 ENCOUNTER — Encounter: Payer: Self-pay | Admitting: *Deleted

## 2021-06-04 ENCOUNTER — Ambulatory Visit
Payer: No Typology Code available for payment source | Attending: Obstetrics and Gynecology | Admitting: Obstetrics and Gynecology

## 2021-06-04 ENCOUNTER — Other Ambulatory Visit: Payer: Self-pay

## 2021-06-04 ENCOUNTER — Ambulatory Visit (HOSPITAL_BASED_OUTPATIENT_CLINIC_OR_DEPARTMENT_OTHER): Payer: Medicaid Other

## 2021-06-04 ENCOUNTER — Other Ambulatory Visit: Payer: Self-pay | Admitting: *Deleted

## 2021-06-04 ENCOUNTER — Other Ambulatory Visit: Payer: Self-pay | Admitting: Obstetrics and Gynecology

## 2021-06-04 ENCOUNTER — Ambulatory Visit: Payer: Medicaid Other | Attending: Obstetrics and Gynecology | Admitting: *Deleted

## 2021-06-04 VITALS — BP 126/62 | HR 78

## 2021-06-04 DIAGNOSIS — O35BXX0 Maternal care for other (suspected) fetal abnormality and damage, fetal cardiac anomalies, not applicable or unspecified: Secondary | ICD-10-CM

## 2021-06-04 DIAGNOSIS — Z3A23 23 weeks gestation of pregnancy: Secondary | ICD-10-CM | POA: Insufficient documentation

## 2021-06-04 DIAGNOSIS — E669 Obesity, unspecified: Secondary | ICD-10-CM | POA: Insufficient documentation

## 2021-06-04 DIAGNOSIS — O99332 Smoking (tobacco) complicating pregnancy, second trimester: Secondary | ICD-10-CM

## 2021-06-04 DIAGNOSIS — O99212 Obesity complicating pregnancy, second trimester: Secondary | ICD-10-CM

## 2021-06-04 DIAGNOSIS — Z6834 Body mass index (BMI) 34.0-34.9, adult: Secondary | ICD-10-CM

## 2021-06-04 DIAGNOSIS — F17210 Nicotine dependence, cigarettes, uncomplicated: Secondary | ICD-10-CM | POA: Insufficient documentation

## 2021-06-04 DIAGNOSIS — O09292 Supervision of pregnancy with other poor reproductive or obstetric history, second trimester: Secondary | ICD-10-CM | POA: Diagnosis present

## 2021-06-04 DIAGNOSIS — Z363 Encounter for antenatal screening for malformations: Secondary | ICD-10-CM | POA: Insufficient documentation

## 2021-06-04 DIAGNOSIS — O358XX0 Maternal care for other (suspected) fetal abnormality and damage, not applicable or unspecified: Secondary | ICD-10-CM | POA: Insufficient documentation

## 2021-06-04 DIAGNOSIS — O09299 Supervision of pregnancy with other poor reproductive or obstetric history, unspecified trimester: Secondary | ICD-10-CM

## 2021-06-04 DIAGNOSIS — Z720 Tobacco use: Secondary | ICD-10-CM

## 2021-06-04 DIAGNOSIS — Z634 Disappearance and death of family member: Secondary | ICD-10-CM | POA: Insufficient documentation

## 2021-06-04 DIAGNOSIS — O219 Vomiting of pregnancy, unspecified: Secondary | ICD-10-CM

## 2021-06-04 MED ORDER — ONDANSETRON 4 MG PO TBDP: 4.0000 mg | ORAL_TABLET | Freq: Three times a day (TID) | ORAL | 0 refills | Status: DC | PRN

## 2021-06-04 NOTE — Progress Notes (Signed)
Maternal-Fetal Medicine   Name: Emily Marsh DOB: 09-03-1995 MRN: [id number] Referring Provider: Milas Hock, MD  I had the pleasure of seeing Ms. Lackey [facility] at [facility]. She is G5 P4003 at 23w 5d gestation and returned for completion of fetal anatomy. In March 2022, she had a term vaginal delivery of a female infant.  Her daughter had trisomy 61 and, unfortunately, died at 46 months of age. In this pregnancy, on cell free fetal DNA screening, the risks of fetal aneuploidies including trisomy 18 are not increased.  Patient had opted not to have amniocentesis. She admits to smoking about 5 to 6 cigarettes daily. She does not have hypertension or diabetes.  Ultrasound Amniotic fluid is normal and good fetal activity is seen. Fetal biometry is consistent with her previously-established dates. Fetal anatomical survey was completed and appears normal. I reassured the patient of the findings.  I counseled the patient on cigarette smoking in pregnancy.  Smoking increases the risk of maternal complications that include placental abruption and placenta previa. Fetal complications include fetal growth restriction, prematurity, stillbirth, and sudden infant death syndrome (SIDS).  Smoking cessation at any gestational age is beneficial and should routinely be addressed in her prenatal visits.   Nicotine-replacement therapy (NRT) can be considered if nonpharmacological approaches have failed. Its chief benefit is that it does not contain other toxic substances present in cigarettes. Cigarettes, in addition to nicotine and Carbon monoxide, contain several other toxic substances.  Transdermal patch of 14 mg/day for 2 weeks followed by 7 mg/day (maintenance) can be considered in her case. Overall, the benefit of NRT outweighs cigarette smoking as cigarettes contain several other compounds including carcinogens that are harmful to her and her pregnancy.  Patient will discuss with  you and decide.  Recommendations -An appointment was made for her to return in 9 weeks for fetal growth assessment. -Smoking cessation counseling at every prenatal visit.  Thank you for consultation.  If you have any questions or concerns, please contact me the [facility].  Consultation including face-to-face (more than 50%) counseling 20 minutes.

## 2021-06-05 ENCOUNTER — Other Ambulatory Visit: Payer: Self-pay | Admitting: Obstetrics & Gynecology

## 2021-06-05 DIAGNOSIS — O219 Vomiting of pregnancy, unspecified: Secondary | ICD-10-CM

## 2021-06-09 ENCOUNTER — Encounter: Payer: Self-pay | Admitting: Obstetrics & Gynecology

## 2021-06-09 ENCOUNTER — Other Ambulatory Visit: Payer: Self-pay | Admitting: Obstetrics

## 2021-06-09 ENCOUNTER — Encounter: Payer: Medicaid Other | Admitting: Obstetrics and Gynecology

## 2021-06-09 DIAGNOSIS — O219 Vomiting of pregnancy, unspecified: Secondary | ICD-10-CM

## 2021-06-09 NOTE — Progress Notes (Deleted)
° °  PRENATAL VISIT NOTE  Subjective:  Emily Marsh is a 25 y.o. E7O3500 at [redacted]w[redacted]d being seen today for ongoing prenatal care.  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  Patient reports {[id number]}.   .  .   . Denies leaking of fluid.   The following portions of the patient's history were reviewed and updated as appropriate: allergies, current medications, past family history, past medical history, past social history, past surgical history and problem list.   Objective:  There were no vitals filed for this visit.  Fetal Status:           General:  Alert, oriented and cooperative. Patient is in no acute distress.  Skin: Skin is warm and dry. No rash noted.   Cardiovascular: Normal heart rate noted  Respiratory: Normal respiratory effort, no problems with respiration noted  Abdomen: Soft, gravid, appropriate for gestational age.        Pelvic: Cervical exam deferred        Extremities: Normal range of motion.     Mental Status: Normal mood and affect. Normal behavior. Normal judgment and thought content.   Assessment and Plan:  Pregnancy: G5P4003 at [redacted]w[redacted]d 1. Supervision of other normal pregnancy, antepartum Discussed 28w labs and tdap next time She is rh positive  2. 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 Tobacco abuse ***  4. Fetal cardiac echogenic focus, antepartum, not applicable or unspecified fetus Plan is for f/u in 9 weeks   Preterm labor symptoms and general obstetric precautions including but not limited to vaginal bleeding, contractions, leaking of fluid and fetal movement were reviewed in detail with the patient. Please refer to After  Visit Summary for other counseling recommendations.   No follow-ups on file.  Future Appointments  Date Time Provider Department Center  06/09/2021  3:50 PM Milas Hock, MD [facility] None  08/05/2021  9:30 AM [facility] NURSE [facility]  08/05/2021  9:45 AM [facility] US5 [facility]    Milas Hock, MD

## 2021-06-22 ENCOUNTER — Other Ambulatory Visit: Payer: Self-pay | Admitting: Obstetrics

## 2021-06-22 DIAGNOSIS — O219 Vomiting of pregnancy, unspecified: Secondary | ICD-10-CM

## 2021-06-24 MED ORDER — ONDANSETRON 4 MG PO TBDP: 4.0000 mg | ORAL_TABLET | Freq: Three times a day (TID) | ORAL | 0 refills | Status: DC | PRN

## 2021-07-07 ENCOUNTER — Telehealth: Payer: Self-pay

## 2021-07-07 ENCOUNTER — Encounter: Payer: Medicaid Other | Admitting: Obstetrics and Gynecology

## 2021-07-07 NOTE — Telephone Encounter (Signed)
called Pt x3, no answer, both numbers rang and then got a busy signal.

## 2021-07-12 ENCOUNTER — Other Ambulatory Visit: Payer: Self-pay | Admitting: Obstetrics and Gynecology

## 2021-07-12 DIAGNOSIS — O219 Vomiting of pregnancy, unspecified: Secondary | ICD-10-CM

## 2021-07-24 ENCOUNTER — Other Ambulatory Visit: Payer: Self-pay | Admitting: Obstetrics and Gynecology

## 2021-07-24 DIAGNOSIS — O219 Vomiting of pregnancy, unspecified: Secondary | ICD-10-CM

## 2021-07-26 MED ORDER — ONDANSETRON 4 MG PO TBDP: 4.0000 mg | ORAL_TABLET | Freq: Three times a day (TID) | ORAL | 0 refills | Status: DC | PRN

## 2021-07-28 ENCOUNTER — Encounter: Payer: Medicaid Other | Admitting: Obstetrics and Gynecology

## 2021-08-05 ENCOUNTER — Ambulatory Visit: Payer: Medicaid Other

## 2021-08-05 ENCOUNTER — Ambulatory Visit: Payer: Medicaid Other | Attending: Obstetrics and Gynecology

## 2021-08-08 ENCOUNTER — Other Ambulatory Visit: Payer: Self-pay | Admitting: Obstetrics and Gynecology

## 2021-08-08 DIAGNOSIS — O219 Vomiting of pregnancy, unspecified: Secondary | ICD-10-CM

## 2021-08-10 MED ORDER — ONDANSETRON 4 MG PO TBDP: 4.0000 mg | ORAL_TABLET | Freq: Three times a day (TID) | ORAL | 0 refills | Status: AC | PRN

## 2021-09-15 ENCOUNTER — Other Ambulatory Visit: Payer: Self-pay | Admitting: Obstetrics and Gynecology

## 2021-09-15 DIAGNOSIS — O219 Vomiting of pregnancy, unspecified: Secondary | ICD-10-CM
